# Patient Record
Sex: Male | Born: 2000 | Hispanic: No | Marital: Single | Smoking: Never smoker
Health system: Southern US, Community
[De-identification: ages and names within clinical notes are randomized; demographics above are authoritative.]

## PROBLEM LIST (undated history)

## (undated) DIAGNOSIS — J45909 Unspecified asthma, uncomplicated: Secondary | ICD-10-CM

## (undated) HISTORY — DX: Unspecified asthma, uncomplicated: J45.909

---
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
## 2021-10-14 DIAGNOSIS — Z0000 Encounter for general adult medical examination without abnormal findings: Secondary | ICD-10-CM | POA: Diagnosis not present

## 2021-10-14 DIAGNOSIS — Z6831 Body mass index (BMI) 31.0-31.9, adult: Secondary | ICD-10-CM | POA: Diagnosis not present

## 2021-10-14 DIAGNOSIS — I10 Essential (primary) hypertension: Secondary | ICD-10-CM | POA: Diagnosis not present

## 2021-12-10 DIAGNOSIS — H5213 Myopia, bilateral: Secondary | ICD-10-CM | POA: Diagnosis not present

## 2021-12-20 DIAGNOSIS — Z6832 Body mass index (BMI) 32.0-32.9, adult: Secondary | ICD-10-CM | POA: Diagnosis not present

## 2021-12-20 DIAGNOSIS — S83512A Sprain of anterior cruciate ligament of left knee, initial encounter: Secondary | ICD-10-CM | POA: Diagnosis not present

## 2022-03-31 DIAGNOSIS — Z0000 Encounter for general adult medical examination without abnormal findings: Secondary | ICD-10-CM | POA: Diagnosis not present

## 2022-03-31 DIAGNOSIS — Z6832 Body mass index (BMI) 32.0-32.9, adult: Secondary | ICD-10-CM | POA: Diagnosis not present

## 2024-07-23 ENCOUNTER — Ambulatory Visit: Payer: Self-pay

## 2024-07-23 NOTE — Telephone Encounter (Signed)
 FYI Only or Action Required?: FYI only for provider: appointment scheduled on 08/23/2024 at 2:30 PM and recommended patient to Urgent Care for evaluation.  Called Nurse Triage reporting Cough and Back Pain.  Symptoms began 2 weeks ago.  Interventions attempted: Rest, hydration, or home remedies.  Symptoms are: unchanged.  Triage Disposition: Go to ED Now (or PCP Triage)  Patient/caregiver understands and will follow disposition?: Yes  Copied from CRM [id number]. Topic: Clinical - Red Word Triage >> Jul 23, 2024  2:55 PM Joesph NOVAK wrote: Red Word that prompted transfer to Nurse Triage: Experiencing back pain, father is speaking. He would like to schedule at [facility]. Reason for Disposition  Patient sounds very sick or weak to the triager  Answer Assessment - Initial Assessment Questions Father calling in for son. States son has been having cold symptoms along with back pain. Wanting to be scheduled for new patient appointment at [facility]. Scheduled for Aug 23, 2024-did place patient on the wait list. Did recommend patient to urgent care.   1. ONSET: When did the cough begin?      Started two weeks ago 2. SEVERITY: How bad is the cough today?      moderate 3. SPUTUM: Describe the color of your sputum (e.g., none, dry cough; clear, white, yellow, green)     Unknown color 4. HEMOPTYSIS: Are you coughing up any blood? If Yes, ask: How much? (e.g., flecks, streaks, tablespoons, etc.)     no 5. DIFFICULTY BREATHING: Are you having difficulty breathing? If Yes, ask: How bad is it? (e.g., mild, moderate, severe)      moderate 6. FEVER: Do you have a fever? If Yes, ask: What is your temperature, how was it measured, and when did it start?     no 7. CARDIAC HISTORY: Do you have any history of heart disease? (e.g., heart attack, congestive heart failure)      no 8. LUNG HISTORY: Do you have any history of lung disease?  (e.g., pulmonary embolus, asthma, emphysema)     no 9. PE  RISK FACTORS: Do you have a history of blood clots? (or: recent major surgery, recent prolonged travel, bedridden)     no 10. OTHER SYMPTOMS: Do you have any other symptoms? (e.g., runny nose, wheezing, chest pain)       Wheezing, runny nose, back pain-upper  12. TRAVEL: Have you traveled out of the country in the last month? (e.g., travel history, exposures)       no  Protocols used: Cough - Acute Productive-A-AH

## 2024-07-30 DIAGNOSIS — K5900 Constipation, unspecified: Secondary | ICD-10-CM | POA: Diagnosis not present

## 2024-07-30 DIAGNOSIS — Z6828 Body mass index (BMI) 28.0-28.9, adult: Secondary | ICD-10-CM | POA: Diagnosis not present

## 2024-07-30 DIAGNOSIS — B3789 Other sites of candidiasis: Secondary | ICD-10-CM | POA: Diagnosis not present

## 2024-08-23 ENCOUNTER — Ambulatory Visit: Admitting: Family Medicine

## 2024-08-23 ENCOUNTER — Encounter: Payer: Self-pay | Admitting: Family Medicine

## 2024-08-23 VITALS — BP 130/83 | HR 96 | Temp 97.4°F | Ht 65.0 in | Wt 166.0 lb

## 2024-08-23 DIAGNOSIS — Z130 Encounter for screening for diseases of the blood and blood-forming organs and certain disorders involving the immune mechanism: Secondary | ICD-10-CM

## 2024-08-23 DIAGNOSIS — Z13228 Encounter for screening for other metabolic disorders: Secondary | ICD-10-CM

## 2024-08-23 DIAGNOSIS — Z136 Encounter for screening for cardiovascular disorders: Secondary | ICD-10-CM

## 2024-08-23 DIAGNOSIS — K1379 Other lesions of oral mucosa: Secondary | ICD-10-CM

## 2024-08-23 DIAGNOSIS — J4520 Mild intermittent asthma, uncomplicated: Secondary | ICD-10-CM

## 2024-08-23 DIAGNOSIS — Z1321 Encounter for screening for nutritional disorder: Secondary | ICD-10-CM | POA: Diagnosis not present

## 2024-08-23 DIAGNOSIS — K5900 Constipation, unspecified: Secondary | ICD-10-CM

## 2024-08-23 DIAGNOSIS — Z1329 Encounter for screening for other suspected endocrine disorder: Secondary | ICD-10-CM | POA: Diagnosis not present

## 2024-08-23 DIAGNOSIS — Z113 Encounter for screening for infections with a predominantly sexual mode of transmission: Secondary | ICD-10-CM

## 2024-08-23 LAB — LIPID PANEL

## 2024-08-23 LAB — BAYER DCA HB A1C WAIVED: HB A1C (BAYER DCA - WAIVED): 5.5 % (ref 4.8–5.6)

## 2024-08-23 MED ORDER — POLYETHYLENE GLYCOL 3350 17 GM/SCOOP PO POWD: 17.0000 g | Freq: Every day | ORAL | 1 refills | Status: AC

## 2024-08-23 NOTE — Progress Notes (Signed)
 "  New Patient Office Visit  Patient ID: Kyle Higgins, Male   DOB: 2000/10/25 23 y.o. MRN: [id number]  Chief Complaint  Patient presents with   Establish Care   GI Problem    Patient reports stomach cramps, constipation, and nausea, off and on for the past 1+ years. Was advised by a previous doctor to take fiber supplement. Patient says the fiber will help a little but not completely. Patient also reports redness near naval.    Mouth Lesions    Off and on gets bumps/sores on lips and tongue   Subjective:     Kyle Higgins presents to establish care  GI Problem  Mouth Lesions  Associated symptoms include mouth sores.    Discussed the use of AI scribe software for clinical note transcription with the patient, who gave verbal consent to proceed.  History of Present Illness   Kyle Higgins is a 24 year old male who presents to establish care with CC for constipation and oral lesions.  Denies any significant medical hx.   Gastrointestinal symptoms - Intermittent abdominal cramps, constipation, and nausea for over one year - Fiber supplementation provides partial relief but does not fully alleviate symptoms - No diarrhea - Daily bowel movements but stool is hard.  - Blood in stool noted approximately one month ago associated with hard stool but no blood since.   Oral mucosal lesions - Sores on tongue and lips intermittently for the past four to five months  Asthma - Uses albuterol inhaler as needed, approximately five to six times per month - No current need for inhaler refill       Outpatient Encounter Medications as of 08/23/2024  Medication Sig   albuterol (VENTOLIN HFA) 108 (90 Base) MCG/ACT inhaler SMARTSIG:1 Puff(s) Via Inhaler Every 6 Hours PRN (Patient taking differently: as needed.)   polyethylene glycol powder (GLYCOLAX/MIRALAX) 17 GM/SCOOP powder Take 17 g by mouth daily. Dissolve 1 capful (17g) in 4-8 ounces of liquid and take by mouth daily.   [DISCONTINUED]  azelastine (ASTELIN) 0.1 % nasal spray Place 1 spray into both nostrils 2 (two) times daily.   [DISCONTINUED] ketoconazole (NIZORAL) 2 % cream Apply topically 2 (two) times daily.   [DISCONTINUED] polyethylene glycol powder (GLYCOLAX/MIRALAX) 17 GM/SCOOP powder SMARTSIG:17 Gram(s) By Mouth Daily PRN   No facility-administered encounter medications on file as of 08/23/2024.    Past Medical History:  Diagnosis Date   Asthma     History reviewed. No pertinent surgical history.  Family History  Problem Relation Age of Onset   Arthritis Mother    Hypertension Mother    Diabetes Father     Social History   Socioeconomic History   Marital status: Single    Spouse name: Not on file   Number of children: Not on file   Years of education: Not on file   Highest education level: Not on file  Occupational History   Not on file  Tobacco Use   Smoking status: Never    Passive exposure: Never   Smokeless tobacco: Never  Vaping Use   Vaping status: Never Used  Substance and Sexual Activity   Alcohol use: Never   Drug use: Never   Sexual activity: Not Currently  Other Topics Concern   Not on file  Social History Narrative   Not on file   Social Drivers of Health   Tobacco Use: Low Risk (08/23/2024)   Patient History    Smoking Tobacco Use: Never    Smokeless Tobacco Use:  Never    Passive Exposure: Never  Physicist, Medical Strain: Not on file  Food Insecurity: Not on file  Transportation Needs: Not on file  Physical Activity: Not on file  Stress: Not on file  Social Connections: Not on file  Intimate Partner Violence: Not on file  Depression (PHQ2-9): Low Risk (08/23/2024)   Depression (PHQ2-9)    PHQ-2 Score: 3  Alcohol Screen: Not on file  Housing: Not on file  Utilities: Not on file  Health Literacy: Not on file    Review of Systems  HENT:  Positive for mouth sores.       Objective:    BP 130/83   Pulse 96   Temp (!) 97.4 F (36.3 C)   Ht 5' 5 (1.651 m)    Wt 166 lb (75.3 kg)   PF 98 L/min   BMI 27.62 kg/m   Physical Exam Vitals reviewed.  Constitutional:      Appearance: Normal appearance.  HENT:     Head: Normocephalic and atraumatic.     Nose: Nose normal.     Mouth/Throat:     Mouth: Mucous membranes are moist.     Pharynx: Oropharynx is clear.     Comments: No oral sores at this time.  Eyes:     Extraocular Movements: Extraocular movements intact.     Conjunctiva/sclera: Conjunctivae normal.     Pupils: Pupils are equal, round, and reactive to light.  Cardiovascular:     Rate and Rhythm: Normal rate and regular rhythm.     Pulses: Normal pulses.     Heart sounds: Normal heart sounds. No murmur heard. Pulmonary:     Effort: Pulmonary effort is normal. No respiratory distress.     Breath sounds: Normal breath sounds.  Musculoskeletal:        General: No deformity. Normal range of motion.     Cervical back: Normal range of motion.  Skin:    General: Skin is warm and dry.  Neurological:     General: No focal deficit present.     Mental Status: He is alert and oriented to person, place, and time.  Psychiatric:        Mood and Affect: Mood normal.        Behavior: Behavior normal.          Assessment & Plan:   Problem List Items Addressed This Visit   None Visit Diagnoses       Mouth sores    -  Primary   Relevant Orders   CBC with Differential   Lipid Panel   Bayer DCA Hb A1c Waived   Iron, TIBC and Ferritin Panel   Vitamin B12   Sedimentation Rate   C-reactive protein   Comprehensive metabolic panel with GFR   Folate   HIV antibody (with reflex)   Bayer DCA Hb A1c Waived     Constipation, unspecified constipation type       Relevant Medications   polyethylene glycol powder (GLYCOLAX/MIRALAX) 17 GM/SCOOP powder     Intermittent asthma without complication, unspecified asthma severity       Relevant Medications   albuterol (VENTOLIN HFA) 108 (90 Base) MCG/ACT inhaler     Screening for endocrine,  nutritional, metabolic and immunity disorder       Relevant Orders   CBC with Differential   Bayer DCA Hb A1c Waived   Comprehensive metabolic panel with GFR   Bayer DCA Hb A1c Waived     Encounter for screening for  cardiovascular disorders       Relevant Orders   Lipid Panel     Screening examination for STD (sexually transmitted disease)       Relevant Orders   HIV antibody (with reflex)   Hepatitis C antibody       Assessment and Plan    Constipation - Prescribed Miralax daily to trial.  - Will reevaluate in 1 month or sooner if symptoms worsen or do not improve with miralax.   Oral mucosal lesions - No lesions at this time. - Lab work obtained due to reports of oral lesions for the past 4-5 months, results pending.  - Advised to photograph future sores and send via MyChart or schedule appointment for evaluation.  Asthma Albuterol use approximately 5-6 x a month. - Patient does not need refill at this time.  - F/u if asthma symptoms increase in intensity or frequency.   General health maintenance - Patient denies any lab work in the past year.  - Routine lab work ordered, results pending.   Dental - Reports brushing teeth and has dental appointment scheduled for next week.        Return in about 4 weeks (around 09/20/2024).   Oneil LELON Severin, FNP [facility]     "

## 2024-08-24 LAB — CBC WITH DIFFERENTIAL/PLATELET
Basophils Absolute: 0.1 x10E3/uL (ref 0.0–0.2)
Basos: 1 %
EOS (ABSOLUTE): 0.1 x10E3/uL (ref 0.0–0.4)
Eos: 1 %
Hematocrit: 48.2 % (ref 37.5–51.0)
Hemoglobin: 16 g/dL (ref 13.0–17.7)
Immature Grans (Abs): 0 x10E3/uL (ref 0.0–0.1)
Immature Granulocytes: 0 %
Lymphocytes Absolute: 3.6 x10E3/uL — ABNORMAL HIGH (ref 0.7–3.1)
Lymphs: 42 %
MCH: 28.8 pg (ref 26.6–33.0)
MCHC: 33.2 g/dL (ref 31.5–35.7)
MCV: 87 fL (ref 79–97)
Monocytes Absolute: 0.7 x10E3/uL (ref 0.1–0.9)
Monocytes: 8 %
Neutrophils Absolute: 4.3 x10E3/uL (ref 1.4–7.0)
Neutrophils: 48 %
Platelets: 268 x10E3/uL (ref 150–450)
RBC: 5.55 x10E6/uL (ref 4.14–5.80)
RDW: 12.2 % (ref 11.6–15.4)
WBC: 8.7 x10E3/uL (ref 3.4–10.8)

## 2024-08-24 LAB — LIPID PANEL
Cholesterol, Total: 172 mg/dL (ref 100–199)
HDL: 71 mg/dL
LDL CALC COMMENT:: 2.4 ratio (ref 0.0–5.0)
LDL Chol Calc (NIH): 89 mg/dL (ref 0–99)
Triglycerides: 61 mg/dL (ref 0–149)
VLDL Cholesterol Cal: 12 mg/dL (ref 5–40)

## 2024-08-24 LAB — COMPREHENSIVE METABOLIC PANEL WITH GFR
ALT: 21 IU/L (ref 0–44)
AST: 27 IU/L (ref 0–40)
Albumin: 5 g/dL (ref 4.3–5.2)
Alkaline Phosphatase: 71 IU/L (ref 47–123)
BUN/Creatinine Ratio: 15 (ref 9–20)
BUN: 15 mg/dL (ref 6–20)
Bilirubin Total: 0.4 mg/dL (ref 0.0–1.2)
CO2: 22 mmol/L (ref 20–29)
Calcium: 10.3 mg/dL — AB (ref 8.7–10.2)
Chloride: 103 mmol/L (ref 96–106)
Creatinine, Ser: 0.97 mg/dL (ref 0.76–1.27)
Globulin, Total: 2.9 g/dL (ref 1.5–4.5)
Glucose: 85 mg/dL (ref 70–99)
Potassium: 4.4 mmol/L (ref 3.5–5.2)
Sodium: 140 mmol/L (ref 134–144)
Total Protein: 7.9 g/dL (ref 6.0–8.5)
eGFR: 113 mL/min/1.73

## 2024-08-24 LAB — IRON,TIBC AND FERRITIN PANEL
Ferritin: 133 ng/mL (ref 30–400)
Iron Saturation: 23 % (ref 15–55)
Iron: 74 ug/dL (ref 38–169)
Total Iron Binding Capacity: 316 ug/dL (ref 250–450)
UIBC: 242 ug/dL (ref 111–343)

## 2024-08-24 LAB — C-REACTIVE PROTEIN: CRP: 2 mg/L (ref 0–10)

## 2024-08-24 LAB — HEPATITIS C ANTIBODY: Hep C Virus Ab: NONREACTIVE

## 2024-08-24 LAB — SEDIMENTATION RATE: Sed Rate: 8 mm/h (ref 0–15)

## 2024-08-24 LAB — VITAMIN B12: Vitamin B-12: 482 pg/mL (ref 232–1245)

## 2024-08-24 LAB — FOLATE: Folate: 8 ng/mL

## 2024-08-24 LAB — HIV ANTIBODY (ROUTINE TESTING W REFLEX): HIV Screen 4th Generation wRfx: NONREACTIVE

## 2024-08-27 ENCOUNTER — Ambulatory Visit: Payer: Self-pay | Admitting: Family Medicine

## 2024-09-04 ENCOUNTER — Ambulatory Visit: Admitting: Family Medicine

## 2024-09-04 ENCOUNTER — Encounter: Payer: Self-pay | Admitting: Family Medicine

## 2024-09-04 VITALS — BP 125/79 | HR 91 | Ht 65.0 in | Wt 167.0 lb

## 2024-09-04 DIAGNOSIS — R1033 Periumbilical pain: Secondary | ICD-10-CM

## 2024-09-04 NOTE — Progress Notes (Addendum)
 "  Acute Office Visit  Patient ID: Kyle Higgins, male    DOB: [birth year], 24 y.o.   MRN: [id number]  PCP: Alcus Oneil ORN, FNP  Chief Complaint  Patient presents with   Abdominal Pain    Patient says for the past 2-3 months he has been having pain in his belly button area that is sometimes so painful that it wakes him up at night. Patient also says sometimes there is a clear discharge that comes from his belly button.   Patient says he wanted to note that right before his stomach pain started, he had a sinus infection and was prescribed an antibiotic and once he finished the medicine, is when the stomach pain began.    Subjective:     Abdominal Pain    Discussed the use of AI scribe software for clinical note transcription with the patient, who gave verbal consent to proceed.  History of Present Illness   Kyle Higgins is a 24 year old male who presents with abdominal pain and drainage from the umbilical area.  Periumbilical abdominal pain - Intermittent pain for the past 2-3 months - Pain localized around the bottom and sides of the umbilicus - Pain described as being intermittent.  - Pain worsens after work or lifting  Umbilical drainage and erythema - Clear drainage from the umbilical area happened about 2 months ago.  - Drainage accompanied by redness at that time - No erythema or drainage since initial event.   Bowel habits - Patient was seen on 08/23/2024 and was recommended to use MiraLAX  for constipation. - Patient reports that he has been using MiraLAX  and having a daily soft BM. - Umbilical pain has not improved with use of MiraLAX .         Review of Systems  Gastrointestinal:  Positive for abdominal pain.       Objective:    BP 125/79 (Cuff Size: Normal)   Pulse 91   Ht 5' 5 (1.651 m)   Wt 167 lb (75.8 kg)   SpO2 98%   BMI 27.79 kg/m    Physical Exam Vitals reviewed.  Constitutional:      Appearance: He is well-developed.  HENT:     Head:  Normocephalic and atraumatic.  Eyes:     Extraocular Movements: Extraocular movements intact.     Pupils: Pupils are equal, round, and reactive to light.  Cardiovascular:     Rate and Rhythm: Normal rate and regular rhythm.     Heart sounds: Normal heart sounds. No murmur heard. Pulmonary:     Effort: Pulmonary effort is normal. No respiratory distress.     Breath sounds: Normal breath sounds.  Abdominal:     Palpations: Abdomen is soft. There is no hepatomegaly, splenomegaly or mass.     Tenderness: There is no abdominal tenderness.     Comments: No erythema or drainage to the umbilicus.   No umbilical hernia palpated.   Skin:    General: Skin is warm and dry.  Neurological:     General: No focal deficit present.     Mental Status: He is alert and oriented to person, place, and time.  Psychiatric:        Mood and Affect: Mood normal.        Behavior: Behavior normal.       No results found for any visits on 09/04/24.     Assessment & Plan:   Problem List Items Addressed This Visit   None Visit Diagnoses  Umbilical pain    -  Primary   Relevant Orders   US  Abdomen Limited       Assessment and Plan    Periumbilical pain - Ordered umbilical ultrasound to assess for hernia or other abnormality.  - Follow up before US  results if symptoms acutely worsen, fever develops, or for any other concerns       No orders of the defined types were placed in this encounter.   Return if symptoms worsen or fail to improve.  Oneil LELON Severin, FNP [facility]   "

## 2024-09-12 ENCOUNTER — Ambulatory Visit (HOSPITAL_COMMUNITY)
Admission: RE | Admit: 2024-09-12 | Discharge: 2024-09-12 | Disposition: A | Discharge status: Home / Self Care | Source: Ambulatory Visit | Attending: Family Medicine | Admitting: Family Medicine

## 2024-09-12 DIAGNOSIS — R1033 Periumbilical pain: Secondary | ICD-10-CM | POA: Diagnosis present

## 2024-09-19 ENCOUNTER — Ambulatory Visit: Payer: Self-pay

## 2024-09-19 ENCOUNTER — Other Ambulatory Visit: Payer: Self-pay | Admitting: Family Medicine

## 2024-09-19 DIAGNOSIS — R1033 Periumbilical pain: Secondary | ICD-10-CM

## 2024-09-20 ENCOUNTER — Encounter: Payer: Self-pay | Admitting: Family Medicine

## 2024-09-20 ENCOUNTER — Ambulatory Visit: Admitting: Family Medicine

## 2024-09-20 VITALS — BP 121/78 | HR 81 | Temp 98.1°F | Ht 65.0 in | Wt 165.6 lb

## 2024-09-20 DIAGNOSIS — R1033 Periumbilical pain: Secondary | ICD-10-CM | POA: Diagnosis not present

## 2024-09-20 DIAGNOSIS — L089 Local infection of the skin and subcutaneous tissue, unspecified: Secondary | ICD-10-CM

## 2024-09-20 MED ORDER — MUPIROCIN 2 % EX OINT: 1.0000 | TOPICAL_OINTMENT | Freq: Two times a day (BID) | CUTANEOUS | 0 refills | Status: AC

## 2024-09-20 MED ORDER — CEPHALEXIN 500 MG PO CAPS: 500.0000 mg | ORAL_CAPSULE | Freq: Four times a day (QID) | ORAL | 0 refills | Status: AC

## 2024-09-20 NOTE — Progress Notes (Signed)
 "  Acute Office Visit  Patient ID: Kyle Higgins, male    DOB: 2001-04-14, 24 y.o.   MRN: [id number]  PCP: Alcus Oneil ORN, FNP  Chief Complaint  Patient presents with   Follow-up    Patient reports that he is still having pain near belly button but says pain is less severe than it was. Patient reports some discharge from belly button with a blackish crust and foul smell.    Subjective:     HPI  Discussed the use of AI scribe software for clinical note transcription with the patient, who gave verbal consent to proceed.  History of Present Illness   Kyle Higgins is a 24 year old male who presents with redness and drainage from the umbilical area.  Patient has been seen for this on 09/04/24.  - Umbilical US  revealed no hernia. Did show shadowing calcifications within the umbilicus.    Umbilical erythema and drainage - Redness and clear drainage from the umbilical area began again a couple days ago.  - Area appears somewhat improved today  Umbilical pain - Intermittent pain localized to the umbilical area  Gastrointestinal symptoms - Soft daily BMs.       ROS     Objective:    BP 121/78 (Cuff Size: Large)   Pulse 81   Temp 98.1 F (36.7 C)   Ht 5' 5 (1.651 m)   Wt 165 lb 9.6 oz (75.1 kg)   SpO2 98%   BMI 27.56 kg/m    Physical Exam Vitals reviewed.  Constitutional:      Appearance: Normal appearance.  HENT:     Head: Normocephalic and atraumatic.     Nose: Nose normal.  Eyes:     Extraocular Movements: Extraocular movements intact.     Conjunctiva/sclera: Conjunctivae normal.     Pupils: Pupils are equal, round, and reactive to light.  Cardiovascular:     Rate and Rhythm: Normal rate and regular rhythm.     Pulses: Normal pulses.     Heart sounds: Normal heart sounds. No murmur heard. Pulmonary:     Effort: Pulmonary effort is normal. No respiratory distress.     Breath sounds: Normal breath sounds.  Musculoskeletal:        General: No deformity.  Normal range of motion.     Cervical back: Normal range of motion.  Skin:    General: Skin is warm and dry.     Comments: Mild erythema to the inside of the umbilicus, no surrounding erythema. Umbilicus deep and unable to observe the base. No drainage.   Neurological:     General: No focal deficit present.     Mental Status: He is alert and oriented to person, place, and time.  Psychiatric:        Mood and Affect: Mood normal.        Behavior: Behavior normal.       No results found for any visits on 09/20/24.     Assessment & Plan:   Problem List Items Addressed This Visit   None Visit Diagnoses       Skin infection    -  Primary   Relevant Medications   cephALEXin  (KEFLEX ) 500 MG capsule   mupirocin  ointment (BACTROBAN ) 2 %     Umbilical pain           Assessment and Plan    Umbilical skin infection - Prescribed Keflex  500 mg orally four times daily x seven days. - Prescribed topical Bactroban  ointment  for seven days.   - Referred to General Surgery for further evaluation of US  findings and due to length of symptoms.  - Advised to keep area clean and monitor symptoms.  - Follow up if symptoms acutely worsen, no improvement over the next 2-3 days, fever develops, or for any other concerns        Meds ordered this encounter  Medications   cephALEXin  (KEFLEX ) 500 MG capsule    Sig: Take 1 capsule (500 mg total) by mouth 4 (four) times daily for 7 days.    Dispense:  28 capsule    Refill:  0    Supervising Provider:   GOTTSCHALK, ASHLY M [[id number]]   mupirocin  ointment (BACTROBAN ) 2 %    Sig: Apply 1 Application topically 2 (two) times daily.    Dispense:  22 g    Refill:  0    Supervising Provider:   JOLINDA NORENE HERO [[id number]]    Return if symptoms worsen or fail to improve.  Oneil LELON Severin, FNP [facility]   "

## 2024-11-21 ENCOUNTER — Encounter: Admitting: Family Medicine
